# Patient Record
Sex: Male | Born: 1995 | Race: White | Hispanic: No | Marital: Single | Smoking: Never smoker
Health system: Southern US, Community
[De-identification: ages and names within clinical notes are randomized; demographics above are authoritative.]

## PROBLEM LIST (undated history)

## (undated) HISTORY — PX: LABRAL REPAIR: SHX5172

---
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
## 2014-03-01 IMAGING — CT CT ABD-PELV W/ CM
2 of 4 series · 14 of 32 positions shown, 19 images · IV contrast (water/omni  & 100ml omni 300)
Comparison: None.

CLINICAL DATA: Right lower quadrant pain.  Diarrhea.  Nausea.

CT ABDOMEN AND PELVIS WITH CONTRAST
TECHNIQUE: Multidetector CT imaging of the abdomen and pelvis was
performed following the standard protocol during bolus
administration of intravenous contrast.
Contrast: 100mL OMNIPAQUE IOHEXOL 300 MG/ML  SOLN

[Series 2: routine abdomen · axial · 0.73mm/px · z∈[-454,-94]mm · 8 of 94 slices shown, 13 images]
[im 11/94  soft-tissue]
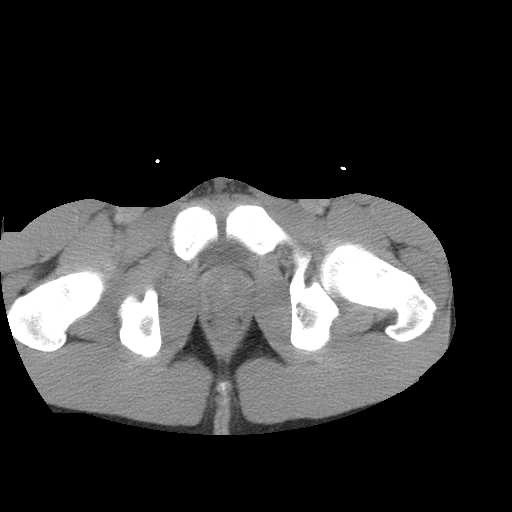
[im 11/94  bone]
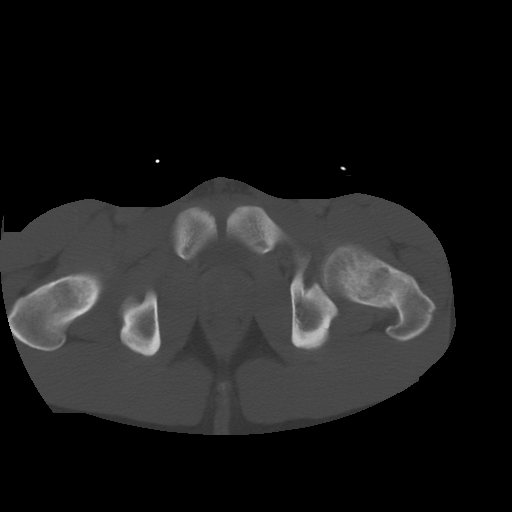
[im 21/94  soft-tissue]
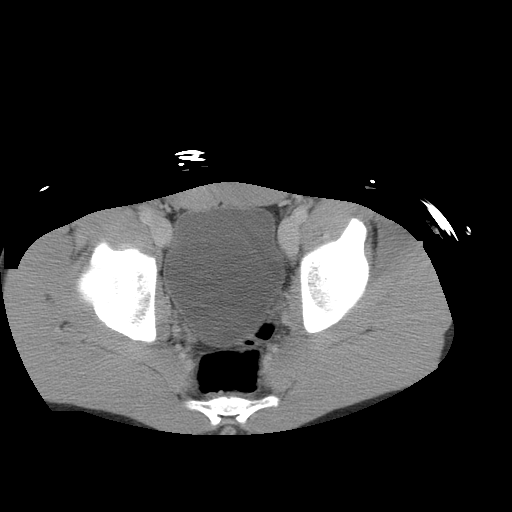
[im 32/94  soft-tissue]
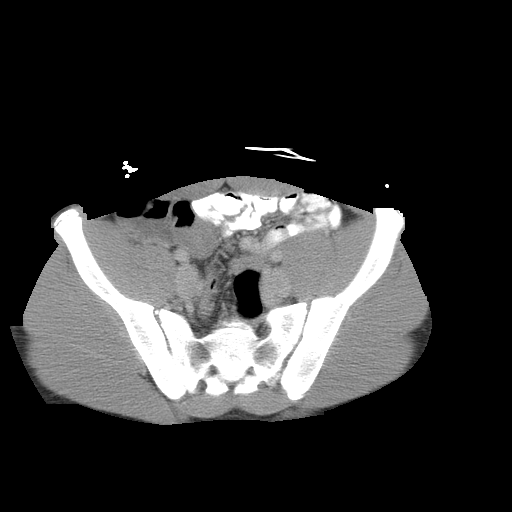
[im 42/94  soft-tissue]
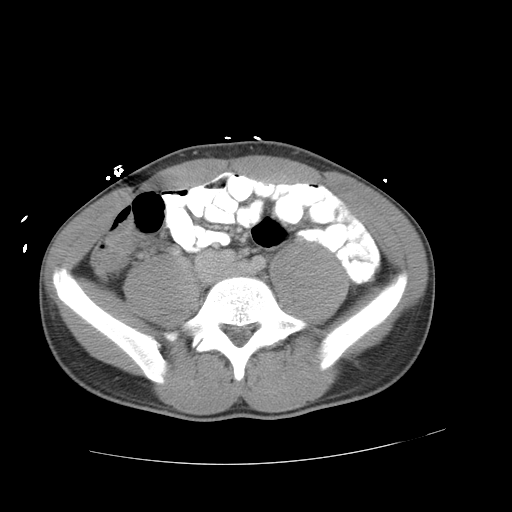
[im 52/94  soft-tissue]
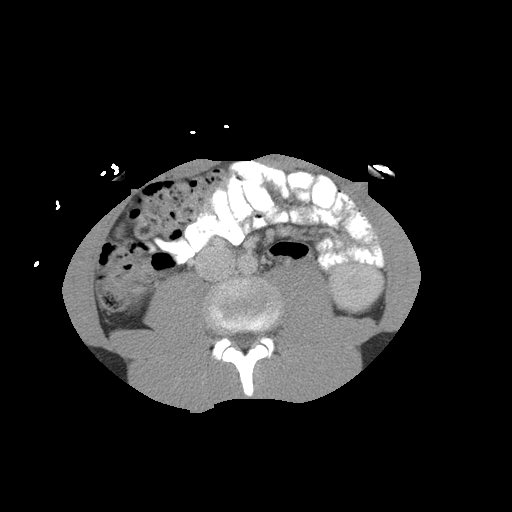
[im 52/94  lung]
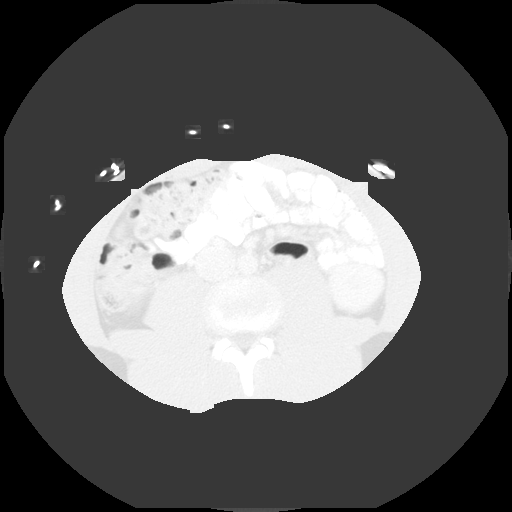
[im 63/94  soft-tissue]
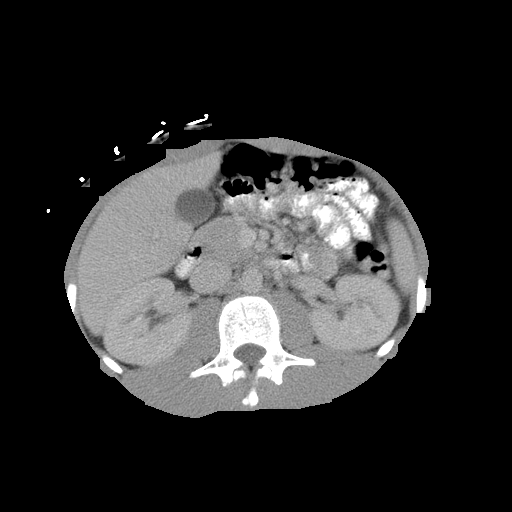
[im 63/94  lung]
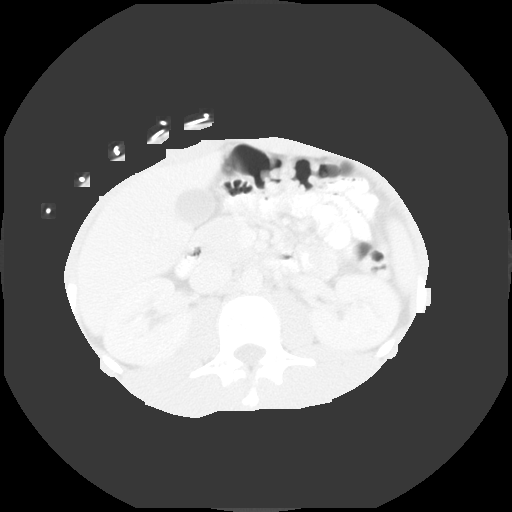
[im 73/94  soft-tissue]
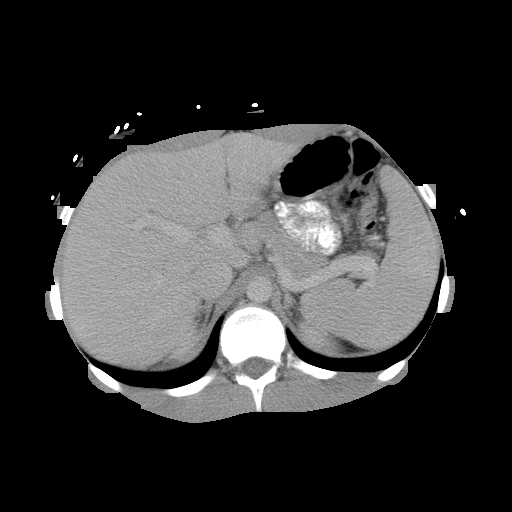
[im 73/94  lung]
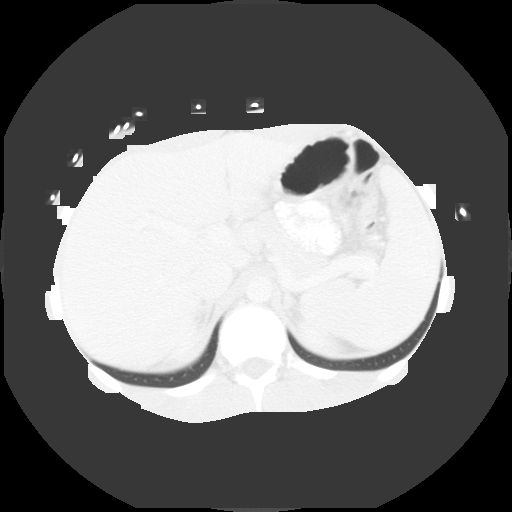
[im 83/94  soft-tissue]
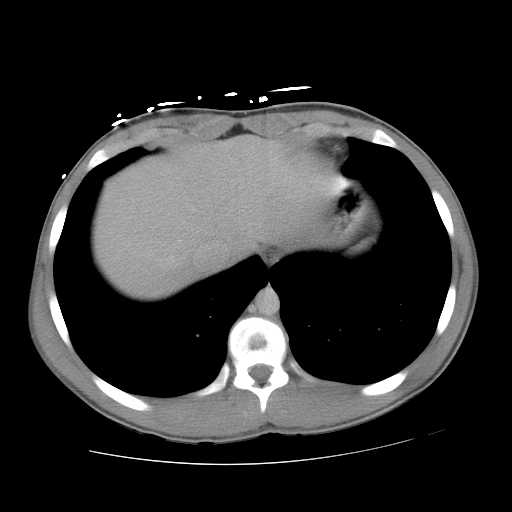
[im 83/94  lung]
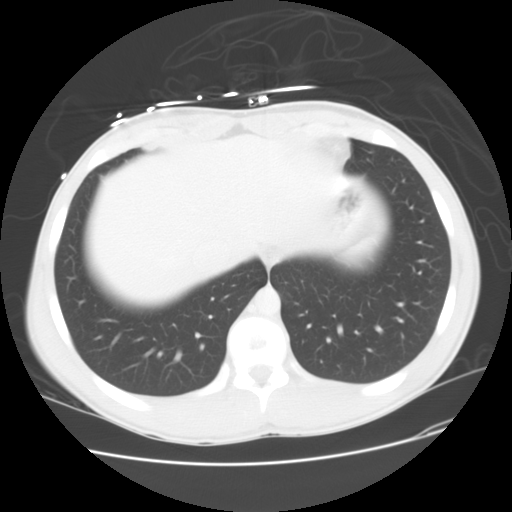

[Series 402: sagittals · sagittal · 0.93mm/px · 6 of 100 slices shown]
[im 10/100  soft-tissue]
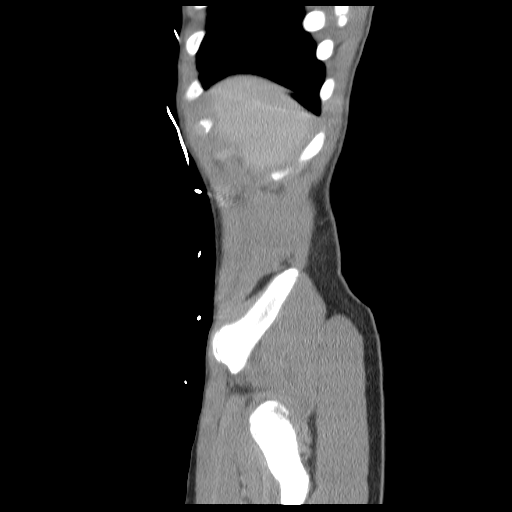
[im 20/100  soft-tissue]
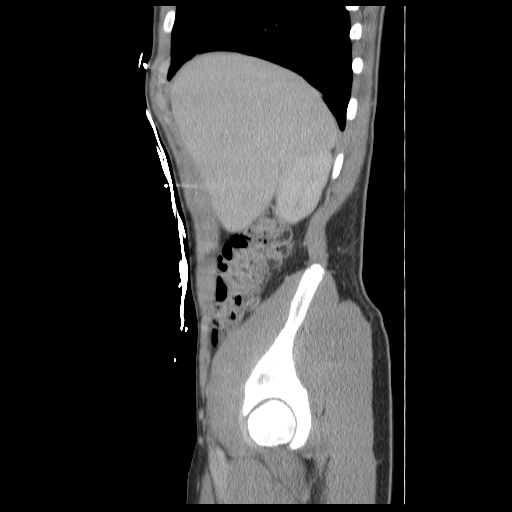
[im 30/100  soft-tissue]
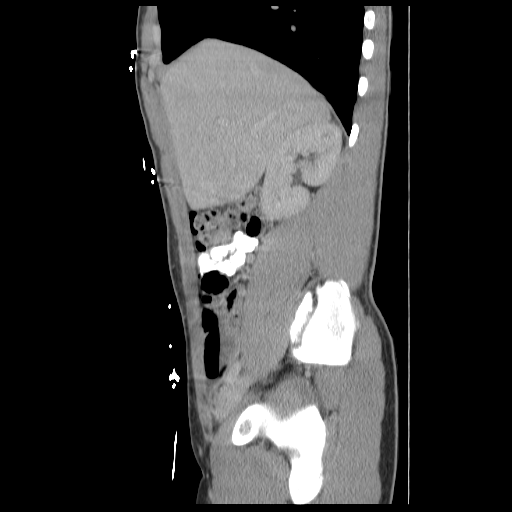
[im 40/100  soft-tissue]
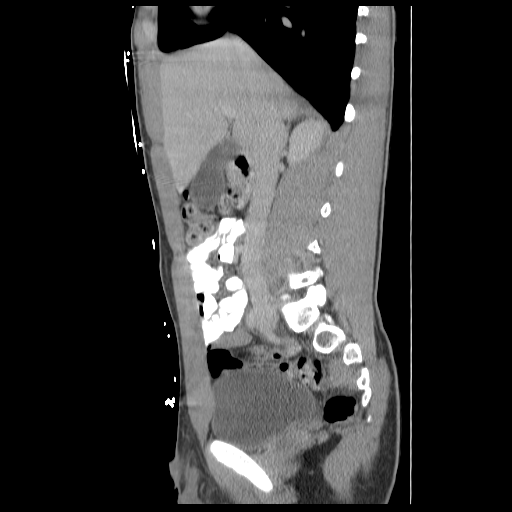
[im 60/100  soft-tissue]
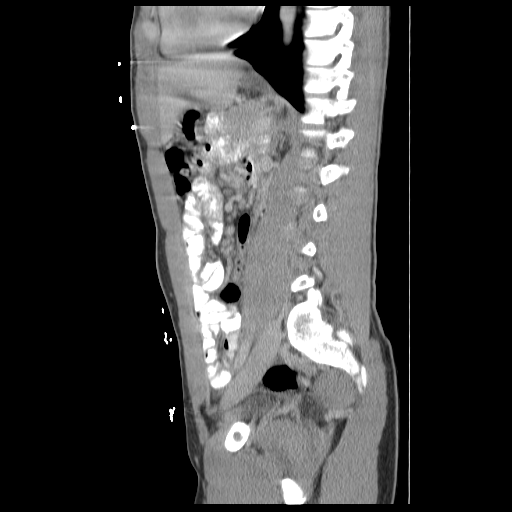
[im 70/100  soft-tissue]
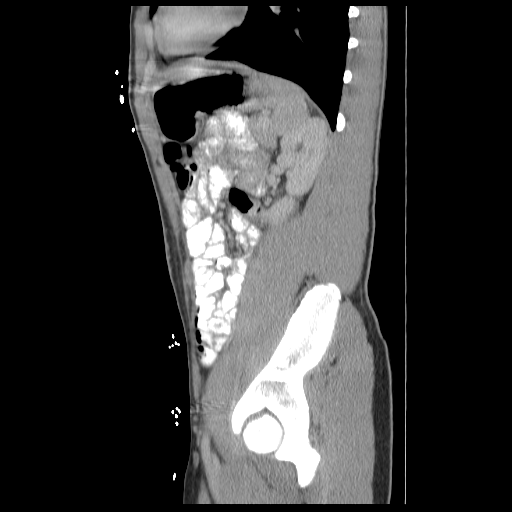

[14 of 32 positions shown; findings below may reference images not displayed]

FINDINGS: The liver, gallbladder, pancreas, spleen, adrenal glands,
and kidneys are normal in appearance.  No evidence of
hydronephrosis.  No soft tissue masses or lymphadenopathy
identified within the abdomen or pelvis.

No evidence of inflammatory process or abnormal fluid collections.
Normal appendix is visualized.  No evidence of bowel wall
thickening or dilatation.
IMPRESSION: Negative.  No evidence of appendicitis or other acute findings.

## 2015-06-12 ENCOUNTER — Emergency Department
Admission: EM | Admit: 2015-06-12 | Discharge: 2015-06-12 | Disposition: A | Discharge status: Home / Self Care | Payer: Self-pay | Attending: Emergency Medicine | Admitting: Emergency Medicine

## 2015-06-12 ENCOUNTER — Encounter: Payer: Self-pay | Admitting: Urgent Care

## 2015-06-12 DIAGNOSIS — Y9289 Other specified places as the place of occurrence of the external cause: Secondary | ICD-10-CM | POA: Insufficient documentation

## 2015-06-12 DIAGNOSIS — Z79899 Other long term (current) drug therapy: Secondary | ICD-10-CM | POA: Insufficient documentation

## 2015-06-12 DIAGNOSIS — S61210A Laceration without foreign body of right index finger without damage to nail, initial encounter: Secondary | ICD-10-CM | POA: Insufficient documentation

## 2015-06-12 DIAGNOSIS — W260XXA Contact with knife, initial encounter: Secondary | ICD-10-CM | POA: Insufficient documentation

## 2015-06-12 DIAGNOSIS — Y998 Other external cause status: Secondary | ICD-10-CM | POA: Insufficient documentation

## 2015-06-12 DIAGNOSIS — Y9389 Activity, other specified: Secondary | ICD-10-CM | POA: Insufficient documentation

## 2015-06-12 MED ORDER — BACITRACIN ZINC 500 UNIT/GM EX OINT
TOPICAL_OINTMENT | Freq: Once | CUTANEOUS | Status: AC
  Administered 2015-06-12: 1 via TOPICAL

## 2015-06-12 MED ORDER — CEPHALEXIN 500 MG PO CAPS
500.0000 mg | ORAL_CAPSULE | Freq: Once | ORAL | Status: AC
  Administered 2015-06-12: 500 mg via ORAL
  Filled 2015-06-12: qty 1

## 2015-06-12 MED ORDER — LIDOCAINE HCL (PF) 1 % IJ SOLN
INTRAMUSCULAR | Status: AC
  Filled 2015-06-12: qty 5

## 2015-06-12 MED ORDER — CEPHALEXIN 500 MG PO CAPS: 500.0000 mg | ORAL_CAPSULE | Freq: Two times a day (BID) | ORAL | Status: AC

## 2015-06-12 MED ORDER — BACITRACIN ZINC 500 UNIT/GM EX OINT
TOPICAL_OINTMENT | CUTANEOUS | Status: AC
  Administered 2015-06-12: 1 via TOPICAL
  Filled 2015-06-12: qty 0.9

## 2015-06-12 NOTE — ED Notes (Signed)
Patient presents with laceration to RIGHT - 2nd digit; near avulsion. Patient reports that he was "sharpening some kind of knife".

## 2015-06-12 NOTE — Discharge Instructions (Signed)

## 2015-06-13 NOTE — ED Provider Notes (Signed)
[facility] Emergency Department Provider Note  ____________________________________________  Time seen: 2:30 AM  I have reviewed the triage vital signs and the nursing notes.   HISTORY  Chief Complaint Laceration     HPI Corey Hunt is a 20 y.o. male presents with accidental laceration to the right index finger. Patient states injury was sustained while sharpening a knife.     Past medical history None There are no active problems to display for this patient.   Past Surgical History  Procedure Laterality Date  . Labral repair      Current Outpatient Rx  Name  Route  Sig  Dispense  Refill  . cephALEXin (KEFLEX) 500 MG capsule   Oral   Take 1 capsule (500 mg total) by mouth 2 (two) times daily.   14 capsule   0   . pseudoephedrine-guaifenesin (MUCINEX D) 60-600 MG per tablet   Oral   Take 1 tablet by mouth every 12 (twelve) hours.           Allergies Review of patient's allergies indicates no known allergies.  No family history on file.  Social History Social History  Substance Use Topics  . Smoking status: Never Smoker   . Smokeless tobacco: None  . Alcohol Use: Yes    Review of Systems  Constitutional: Negative for fever. Eyes: Negative for visual changes. ENT: Negative for sore throat. Cardiovascular: Negative for chest pain. Respiratory: Negative for shortness of breath. Gastrointestinal: Negative for abdominal pain, vomiting and diarrhea. Genitourinary: Negative for dysuria. Musculoskeletal: Negative for back pain. Positive for right index finger laceration Skin: Negative for rash. Neurological: Negative for headaches, focal weakness or numbness.   10-point ROS otherwise negative.  ____________________________________________   PHYSICAL EXAM:  VITAL SIGNS: ED Triage Vitals  Enc Vitals Group     BP 06/12/15 0119 162/86 mmHg     Pulse Rate 06/12/15 0119 98     Resp 06/12/15 0119 16     Temp  06/12/15 0119 97.5 F (36.4 C)     Temp Source 06/12/15 0119 Oral     SpO2 06/12/15 0119 100 %     Weight 06/12/15 0119 200 lb (90.719 kg)     Height 06/12/15 0119  (1.854 m)     Head Cir --      Peak Flow --      Pain Score 06/12/15 0119 7     Pain Loc --      Pain Edu? --      Excl. in GC? --     Constitutional: Alert and oriented. Well appearing and in no distress. Musculoskeletal: Nontender with normal range of motion in all extremities. No joint effusions.  No lower extremity tenderness nor edema. Neurologic:  Normal speech and language. No gross focal neurologic deficits are appreciated. Speech is normal.  Skin:  Skin is warm, dry and intact. No rash noted. Near-complete avulsion of the right index finger tip    Procedure note: LACERATION REPAIR Performed by: Darci Current Authorized by: Darci Current Consent: Verbal consent obtained. Risks and benefits: risks, benefits and alternatives were discussed Consent given by: patient Patient identity confirmed: provided demographic data Prepped and Draped in normal sterile fashion Wound explored  Laceration Location: Right index finger  Laceration Length: 1 cm  No Foreign Bodies seen or palpated  Anesthesia: local infiltration  Local anesthetic: lidocaine 1%   Anesthetic total: 3 ml  Irrigation method: syringe Amount of cleaning: standard  Skin closure: 6-0 nylon  Number of sutures: 5  Technique: Simple interrupted   Patient tolerance: Patient tolerated the procedure well with no immediate complications.    INITIAL IMPRESSION / ASSESSMENT AND PLAN / ED COURSE  Pertinent labs & imaging results that were available during my care of the patient were reviewed by me and considered in my medical decision making (see chart for details).    ____________________________________________   FINAL CLINICAL IMPRESSION(S) / ED DIAGNOSES  Final diagnoses:  Laceration of right index finger w/o foreign  body w/o damage to nail, initial encounter      Darci Current, MD 06/13/15 [phone]
